# Patient Record
Sex: Male | Born: 1950
Health system: Southern US, Community
[De-identification: ages and names within clinical notes are randomized; demographics above are authoritative.]

---

## 2008-09-11 ENCOUNTER — Encounter: Admission: RE | Admit: 2008-09-11 | Discharge: 2008-09-11 | Payer: Self-pay | Admitting: Family Medicine

## 2009-11-06 IMAGING — CR DG CHEST 2V
2 series · 2 of 2 positions shown · non-contrast
Comparison: None

CLINICAL DATA: Right posterior chest pain inferior to the right
scapula and radiating to the axilla.

CHEST - 2 VIEW

[w chest pa]
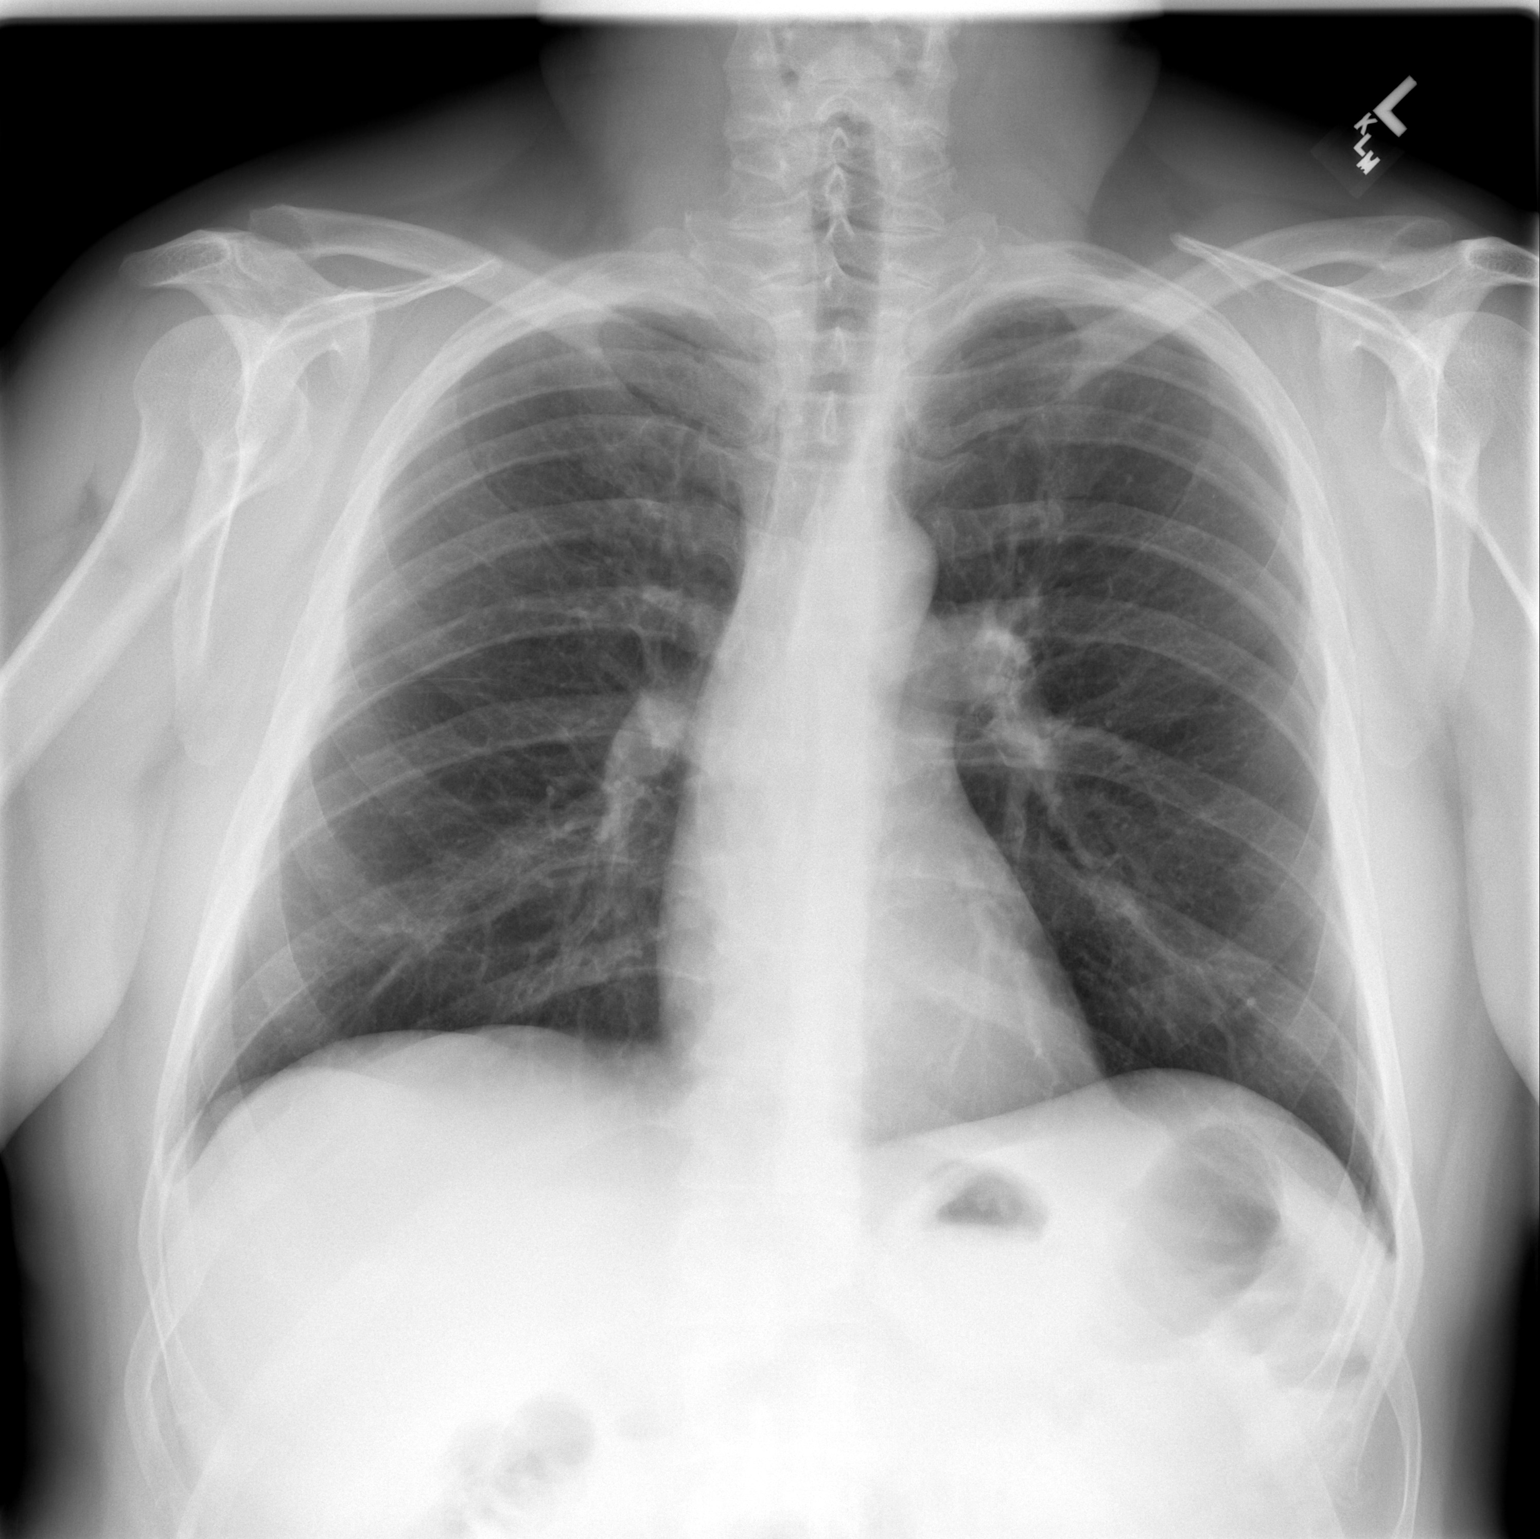

[w chest lat]
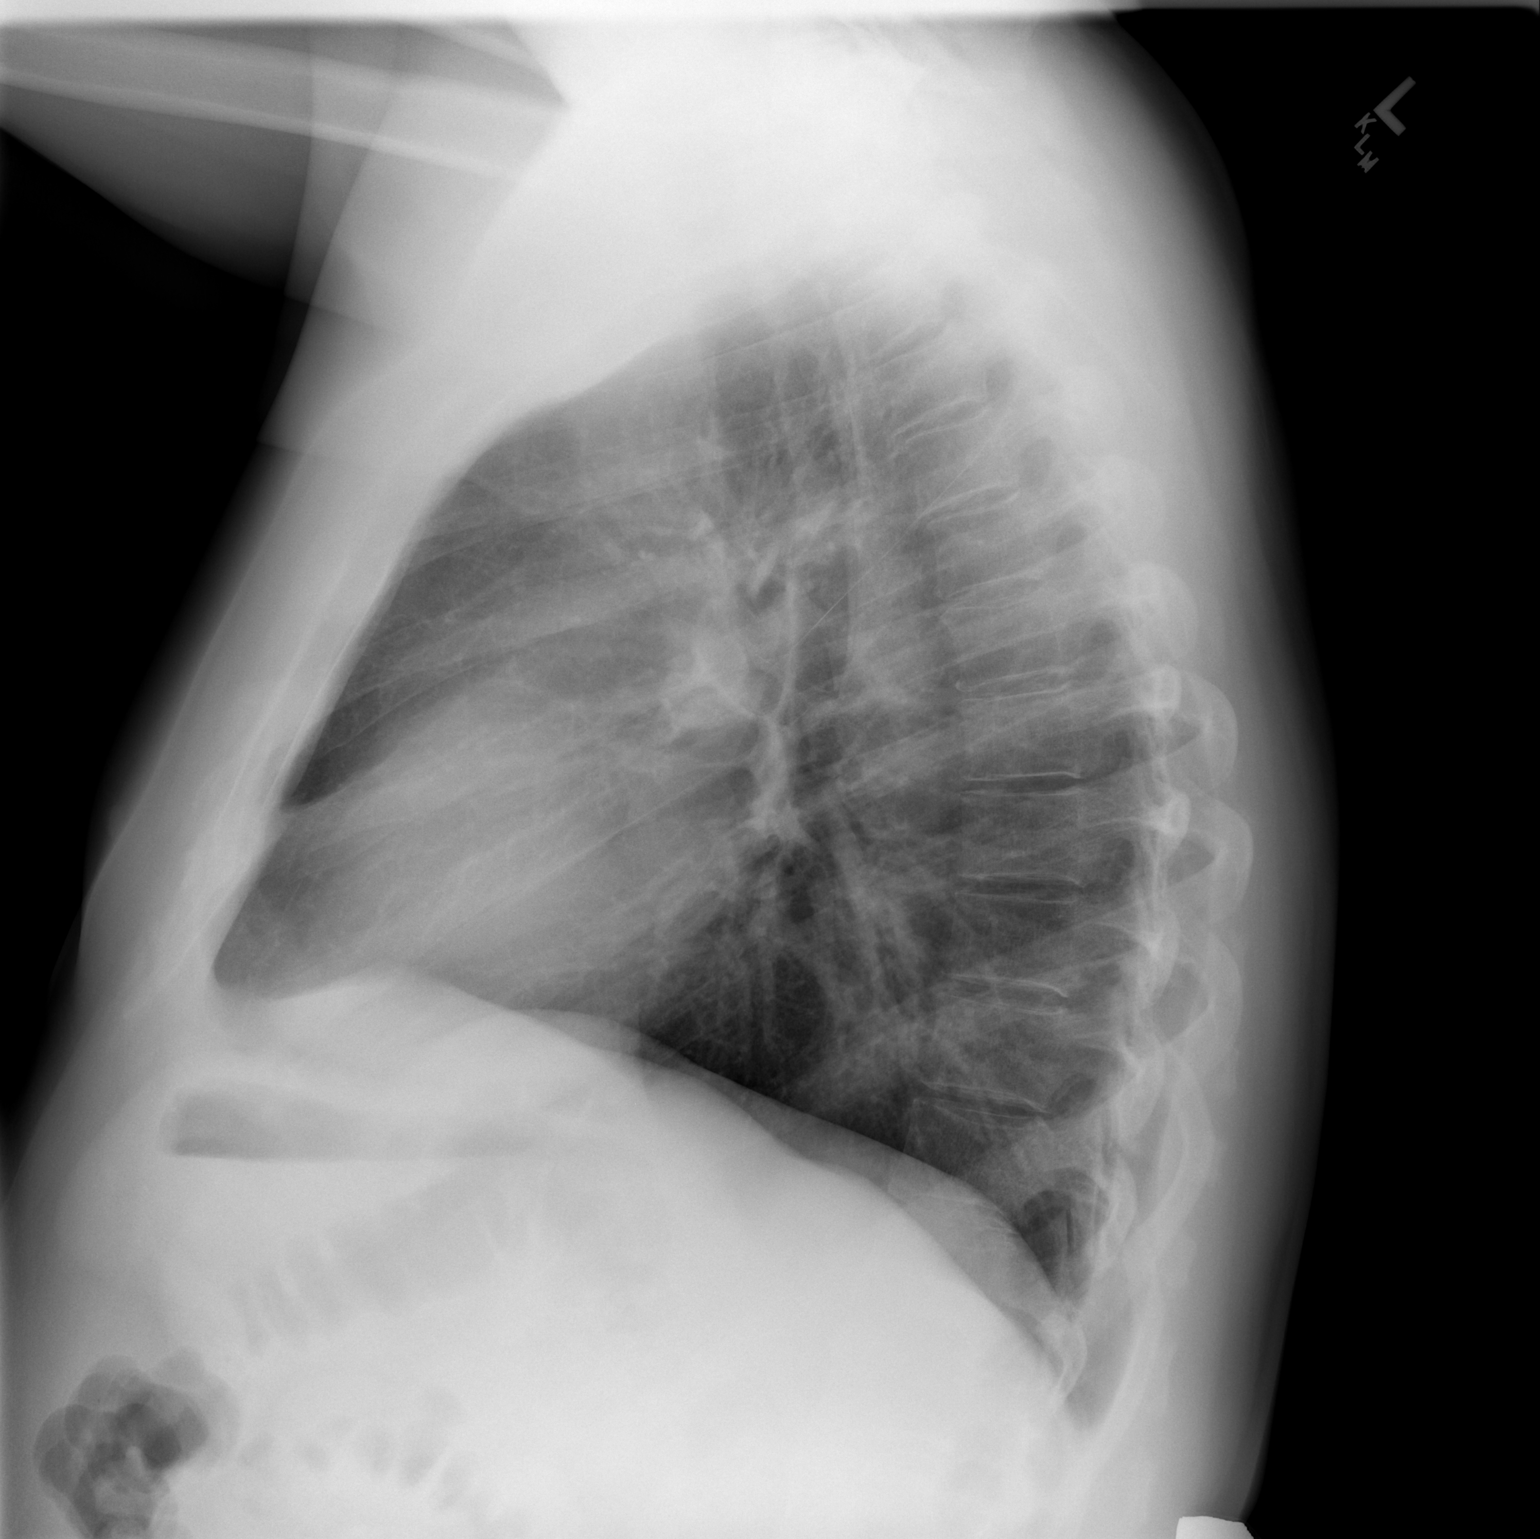

[2 of 2 positions shown; findings below may reference images not displayed]

FINDINGS: The heart size and pulmonary vascularity are normal and the lungs
are clear.  No significant bony abnormalities.
IMPRESSION: Normal chest.

## 2016-07-17 DIAGNOSIS — R6889 Other general symptoms and signs: Secondary | ICD-10-CM | POA: Diagnosis not present

## 2016-07-17 DIAGNOSIS — J069 Acute upper respiratory infection, unspecified: Secondary | ICD-10-CM | POA: Diagnosis not present

## 2016-07-17 DIAGNOSIS — R05 Cough: Secondary | ICD-10-CM | POA: Diagnosis not present

## 2016-09-05 DIAGNOSIS — T169XXA Foreign body in ear, unspecified ear, initial encounter: Secondary | ICD-10-CM | POA: Diagnosis not present

## 2016-10-29 DIAGNOSIS — E7800 Pure hypercholesterolemia, unspecified: Secondary | ICD-10-CM | POA: Diagnosis not present

## 2016-10-29 DIAGNOSIS — N529 Male erectile dysfunction, unspecified: Secondary | ICD-10-CM | POA: Diagnosis not present

## 2016-10-29 DIAGNOSIS — Z0000 Encounter for general adult medical examination without abnormal findings: Secondary | ICD-10-CM | POA: Diagnosis not present

## 2016-10-29 DIAGNOSIS — Z1159 Encounter for screening for other viral diseases: Secondary | ICD-10-CM | POA: Diagnosis not present

## 2016-10-29 DIAGNOSIS — Z13220 Encounter for screening for lipoid disorders: Secondary | ICD-10-CM | POA: Diagnosis not present

## 2016-10-29 DIAGNOSIS — Z23 Encounter for immunization: Secondary | ICD-10-CM | POA: Diagnosis not present

## 2016-10-29 DIAGNOSIS — Z86010 Personal history of colonic polyps: Secondary | ICD-10-CM | POA: Diagnosis not present

## 2016-10-29 DIAGNOSIS — I10 Essential (primary) hypertension: Secondary | ICD-10-CM | POA: Diagnosis not present

## 2017-02-21 DIAGNOSIS — Z23 Encounter for immunization: Secondary | ICD-10-CM | POA: Diagnosis not present

## 2017-06-08 DIAGNOSIS — J300 Vasomotor rhinitis: Secondary | ICD-10-CM | POA: Diagnosis not present

## 2017-06-08 DIAGNOSIS — R0683 Snoring: Secondary | ICD-10-CM | POA: Diagnosis not present

## 2017-08-05 DIAGNOSIS — D1801 Hemangioma of skin and subcutaneous tissue: Secondary | ICD-10-CM | POA: Diagnosis not present

## 2017-08-05 DIAGNOSIS — L821 Other seborrheic keratosis: Secondary | ICD-10-CM | POA: Diagnosis not present

## 2017-08-05 DIAGNOSIS — L720 Epidermal cyst: Secondary | ICD-10-CM | POA: Diagnosis not present

## 2017-08-05 DIAGNOSIS — D2371 Other benign neoplasm of skin of right lower limb, including hip: Secondary | ICD-10-CM | POA: Diagnosis not present

## 2017-08-05 DIAGNOSIS — L578 Other skin changes due to chronic exposure to nonionizing radiation: Secondary | ICD-10-CM | POA: Diagnosis not present

## 2017-11-04 DIAGNOSIS — Z23 Encounter for immunization: Secondary | ICD-10-CM | POA: Diagnosis not present

## 2017-11-04 DIAGNOSIS — Z125 Encounter for screening for malignant neoplasm of prostate: Secondary | ICD-10-CM | POA: Diagnosis not present

## 2017-11-04 DIAGNOSIS — I10 Essential (primary) hypertension: Secondary | ICD-10-CM | POA: Diagnosis not present

## 2017-11-04 DIAGNOSIS — E7800 Pure hypercholesterolemia, unspecified: Secondary | ICD-10-CM | POA: Diagnosis not present

## 2017-11-04 DIAGNOSIS — N529 Male erectile dysfunction, unspecified: Secondary | ICD-10-CM | POA: Diagnosis not present

## 2017-11-04 DIAGNOSIS — Z0000 Encounter for general adult medical examination without abnormal findings: Secondary | ICD-10-CM | POA: Diagnosis not present

## 2017-11-04 DIAGNOSIS — Z86010 Personal history of colonic polyps: Secondary | ICD-10-CM | POA: Diagnosis not present

## 2018-08-09 DIAGNOSIS — D1801 Hemangioma of skin and subcutaneous tissue: Secondary | ICD-10-CM | POA: Diagnosis not present

## 2018-08-09 DIAGNOSIS — D239 Other benign neoplasm of skin, unspecified: Secondary | ICD-10-CM | POA: Diagnosis not present

## 2018-08-09 DIAGNOSIS — L720 Epidermal cyst: Secondary | ICD-10-CM | POA: Diagnosis not present

## 2018-08-09 DIAGNOSIS — L300 Nummular dermatitis: Secondary | ICD-10-CM | POA: Diagnosis not present

## 2018-08-09 DIAGNOSIS — L578 Other skin changes due to chronic exposure to nonionizing radiation: Secondary | ICD-10-CM | POA: Diagnosis not present

## 2018-08-16 DIAGNOSIS — L720 Epidermal cyst: Secondary | ICD-10-CM | POA: Diagnosis not present

## 2018-12-21 ENCOUNTER — Other Ambulatory Visit: Payer: Self-pay

## 2018-12-21 DIAGNOSIS — Z20822 Contact with and (suspected) exposure to covid-19: Secondary | ICD-10-CM

## 2018-12-23 LAB — NOVEL CORONAVIRUS, NAA: SARS-CoV-2, NAA: NOT DETECTED

## 2019-01-18 DIAGNOSIS — E7800 Pure hypercholesterolemia, unspecified: Secondary | ICD-10-CM | POA: Diagnosis not present

## 2019-01-18 DIAGNOSIS — N529 Male erectile dysfunction, unspecified: Secondary | ICD-10-CM | POA: Diagnosis not present

## 2019-01-18 DIAGNOSIS — Z0000 Encounter for general adult medical examination without abnormal findings: Secondary | ICD-10-CM | POA: Diagnosis not present

## 2019-01-18 DIAGNOSIS — Z1389 Encounter for screening for other disorder: Secondary | ICD-10-CM | POA: Diagnosis not present

## 2019-01-18 DIAGNOSIS — I10 Essential (primary) hypertension: Secondary | ICD-10-CM | POA: Diagnosis not present

## 2019-01-18 DIAGNOSIS — Z125 Encounter for screening for malignant neoplasm of prostate: Secondary | ICD-10-CM | POA: Diagnosis not present

## 2019-01-18 DIAGNOSIS — Z23 Encounter for immunization: Secondary | ICD-10-CM | POA: Diagnosis not present

## 2019-01-18 DIAGNOSIS — Z86010 Personal history of colonic polyps: Secondary | ICD-10-CM | POA: Diagnosis not present

## 2019-03-07 DIAGNOSIS — Z20828 Contact with and (suspected) exposure to other viral communicable diseases: Secondary | ICD-10-CM | POA: Diagnosis not present

## 2019-03-14 DIAGNOSIS — Z20828 Contact with and (suspected) exposure to other viral communicable diseases: Secondary | ICD-10-CM | POA: Diagnosis not present

## 2019-03-18 DIAGNOSIS — Z20828 Contact with and (suspected) exposure to other viral communicable diseases: Secondary | ICD-10-CM | POA: Diagnosis not present

## 2019-06-06 DIAGNOSIS — H524 Presbyopia: Secondary | ICD-10-CM | POA: Diagnosis not present

## 2019-06-06 DIAGNOSIS — H25813 Combined forms of age-related cataract, bilateral: Secondary | ICD-10-CM | POA: Diagnosis not present

## 2019-07-25 DIAGNOSIS — Z23 Encounter for immunization: Secondary | ICD-10-CM | POA: Diagnosis not present

## 2019-08-14 DIAGNOSIS — D128 Benign neoplasm of rectum: Secondary | ICD-10-CM | POA: Diagnosis not present

## 2019-08-14 DIAGNOSIS — K5730 Diverticulosis of large intestine without perforation or abscess without bleeding: Secondary | ICD-10-CM | POA: Diagnosis not present

## 2019-08-14 DIAGNOSIS — Z86010 Personal history of colonic polyps: Secondary | ICD-10-CM | POA: Diagnosis not present

## 2019-08-14 DIAGNOSIS — Z1211 Encounter for screening for malignant neoplasm of colon: Secondary | ICD-10-CM | POA: Diagnosis not present

## 2019-08-14 DIAGNOSIS — K621 Rectal polyp: Secondary | ICD-10-CM | POA: Diagnosis not present

## 2019-11-27 DIAGNOSIS — L578 Other skin changes due to chronic exposure to nonionizing radiation: Secondary | ICD-10-CM | POA: Diagnosis not present

## 2019-11-27 DIAGNOSIS — L821 Other seborrheic keratosis: Secondary | ICD-10-CM | POA: Diagnosis not present

## 2019-11-27 DIAGNOSIS — D1801 Hemangioma of skin and subcutaneous tissue: Secondary | ICD-10-CM | POA: Diagnosis not present

## 2019-11-27 DIAGNOSIS — D225 Melanocytic nevi of trunk: Secondary | ICD-10-CM | POA: Diagnosis not present

## 2020-01-29 DIAGNOSIS — R208 Other disturbances of skin sensation: Secondary | ICD-10-CM | POA: Diagnosis not present

## 2020-01-29 DIAGNOSIS — R0683 Snoring: Secondary | ICD-10-CM | POA: Diagnosis not present

## 2020-01-29 DIAGNOSIS — E7800 Pure hypercholesterolemia, unspecified: Secondary | ICD-10-CM | POA: Diagnosis not present

## 2020-01-29 DIAGNOSIS — I10 Essential (primary) hypertension: Secondary | ICD-10-CM | POA: Diagnosis not present

## 2020-01-29 DIAGNOSIS — Z1389 Encounter for screening for other disorder: Secondary | ICD-10-CM | POA: Diagnosis not present

## 2020-01-29 DIAGNOSIS — Z0000 Encounter for general adult medical examination without abnormal findings: Secondary | ICD-10-CM | POA: Diagnosis not present

## 2020-01-29 DIAGNOSIS — N529 Male erectile dysfunction, unspecified: Secondary | ICD-10-CM | POA: Diagnosis not present

## 2020-01-29 DIAGNOSIS — Z125 Encounter for screening for malignant neoplasm of prostate: Secondary | ICD-10-CM | POA: Diagnosis not present

## 2020-02-21 DIAGNOSIS — G4733 Obstructive sleep apnea (adult) (pediatric): Secondary | ICD-10-CM | POA: Diagnosis not present

## 2020-05-07 DIAGNOSIS — G4719 Other hypersomnia: Secondary | ICD-10-CM | POA: Diagnosis not present

## 2020-05-07 DIAGNOSIS — I10 Essential (primary) hypertension: Secondary | ICD-10-CM | POA: Diagnosis not present

## 2020-05-09 ENCOUNTER — Other Ambulatory Visit (HOSPITAL_BASED_OUTPATIENT_CLINIC_OR_DEPARTMENT_OTHER): Payer: Self-pay

## 2020-05-09 DIAGNOSIS — R0681 Apnea, not elsewhere classified: Secondary | ICD-10-CM

## 2020-05-09 DIAGNOSIS — G2581 Restless legs syndrome: Secondary | ICD-10-CM

## 2020-05-09 DIAGNOSIS — R0683 Snoring: Secondary | ICD-10-CM

## 2020-05-09 DIAGNOSIS — G4710 Hypersomnia, unspecified: Secondary | ICD-10-CM

## 2020-05-28 DIAGNOSIS — L821 Other seborrheic keratosis: Secondary | ICD-10-CM | POA: Diagnosis not present

## 2020-05-28 DIAGNOSIS — L570 Actinic keratosis: Secondary | ICD-10-CM | POA: Diagnosis not present

## 2020-05-28 DIAGNOSIS — L820 Inflamed seborrheic keratosis: Secondary | ICD-10-CM | POA: Diagnosis not present

## 2020-05-28 DIAGNOSIS — D1801 Hemangioma of skin and subcutaneous tissue: Secondary | ICD-10-CM | POA: Diagnosis not present

## 2020-06-26 ENCOUNTER — Ambulatory Visit (HOSPITAL_BASED_OUTPATIENT_CLINIC_OR_DEPARTMENT_OTHER): Payer: Medicare Other | Attending: Internal Medicine | Admitting: Internal Medicine

## 2020-06-26 ENCOUNTER — Other Ambulatory Visit: Payer: Self-pay

## 2020-06-26 VITALS — Ht 71.0 in | Wt 190.0 lb

## 2020-06-26 DIAGNOSIS — G4733 Obstructive sleep apnea (adult) (pediatric): Secondary | ICD-10-CM

## 2020-06-26 DIAGNOSIS — R0683 Snoring: Secondary | ICD-10-CM

## 2020-06-26 DIAGNOSIS — R0681 Apnea, not elsewhere classified: Secondary | ICD-10-CM

## 2020-06-26 DIAGNOSIS — G4710 Hypersomnia, unspecified: Secondary | ICD-10-CM | POA: Insufficient documentation

## 2020-06-26 DIAGNOSIS — G2581 Restless legs syndrome: Secondary | ICD-10-CM

## 2020-06-30 DIAGNOSIS — G4733 Obstructive sleep apnea (adult) (pediatric): Secondary | ICD-10-CM | POA: Diagnosis not present

## 2020-06-30 NOTE — Procedures (Signed)
   NAME: Gary Glover DATE OF BIRTH:  March 26, 1950 MEDICAL RECORD NUMBER [id number]  LOCATION: [facility]  PHYSICIAN: Marius Ditch  DATE OF STUDY: 06/26/2020  SLEEP STUDY TYPE: Nocturnal Polysomnogram               REFERRING PHYSICIAN: Loma Sousa, MD  EPWORTH SLEEPINESS SCORE:  13 HEIGHT: 5\' 11"  (180.3 cm)  WEIGHT: 190 lb (86.2 kg)    Body mass index is 26.5 kg/m.  NECK SIZE: 16.25 in.  CLINICAL INFORMATION The patient was referred to the sleep center for evaluation of witnessed apnea, excessive daytime sleepiness, awakening gasping for air.   MEDICATIONS Patient self administered medications include: N/A. Medications administered during study include No sleep medicine administered.Marland Kitchen  SLEEP STUDY TECHNIQUE A multi-channel overnight Polysomnography study was performed. The channels recorded and monitored were central and occipital EEG, electrooculogram (EOG), submentalis EMG (chin), nasal and oral airflow, thoracic and abdominal wall motion, anterior tibialis EMG, snore microphone, electrocardiogram, and a pulse oximetry.  TECHNICAL COMMENTS Comments added by Technician: Patient had difficulty initiating sleep. Comments added by Scorer: N/A  SLEEP ARCHITECTURE The study was initiated at 10:46:25 PM and terminated at 5:14:28 AM. The total recorded time was 388.1 minutes. EEG confirmed total sleep time was 228 minutes yielding a sleep efficiency of 58.8%%. Sleep onset after lights out was 92.4 minutes with a REM latency of 168.0 minutes. The patient spent 21.9%% of the night in stage N1 sleep, 57.2%% in stage N2 sleep, 0.0%% in stage N3 and 20.8% in REM. Wake after sleep onset (WASO) was 67.6 minutes. The Arousal Index was 47.1/hour.  RESPIRATORY PARAMETERS There were a total of 27 respiratory disturbances out of which 0 were apneas ( 0 obstructive, 0 mixed, 0 central) and 27 hypopneas. The apnea/hypopnea index (AHI) was 7.1 events/hour. The central sleep  apnea index was 0 events/hour. The REM AHI was 11.4 events/hour and NREM AHI was 6.0 events/hour. The supine AHI was 34.3 events/hour and the non supine AHI was 4.9 events/hour. He slept supine during 7.68% of sleep time. Respiratory disturbance index was 38 events/hour overall and 20 events/hour in REM sleep.  Respiratory disturbances were associated with oxygen desaturation down to a nadir of 81.0% during sleep. The mean oxygen saturation during the study was 95.0%. The cumulative time under 88% oxygen saturation was 1.5 minutes.  LEG MOVEMENT DATA The total leg movements were 46 with a resulting leg movement index of 12.1/hr. Many leg movements were associated with RERAs. Associated arousal with leg movement index was 0.8/hr.  CARDIAC DATA The underlying cardiac rhythm was most consistent with sinus rhythm. Mean heart rate during sleep was 59.6 bpm. Additional rhythm abnormalities include None.  IMPRESSIONS - Mild Obstructive Sleep Apnea (OSA) by AHI; severe by RDI - Decreased sleep efficiency and decreased REM sleep. - No significant periodic leg movements(PLMs) during sleep. However, no significant associated arousals.  DIAGNOSIS - Obstructive Sleep Apnea (G47.33)  RECOMMENDATIONS - Treatment choices include weight loss (if appropriate), oral appliance or CPAP.  Marius Ditch Sleep specialist, [place] [employer]  ELECTRONICALLY SIGNED ON:  06/30/2020, 2:05 PM [place] PH: [phone]   FX: [phone] [place]

## 2020-10-03 DIAGNOSIS — G4733 Obstructive sleep apnea (adult) (pediatric): Secondary | ICD-10-CM | POA: Diagnosis not present

## 2020-11-03 DIAGNOSIS — G4733 Obstructive sleep apnea (adult) (pediatric): Secondary | ICD-10-CM | POA: Diagnosis not present

## 2020-12-04 DIAGNOSIS — G4733 Obstructive sleep apnea (adult) (pediatric): Secondary | ICD-10-CM | POA: Diagnosis not present

## 2020-12-10 DIAGNOSIS — G4733 Obstructive sleep apnea (adult) (pediatric): Secondary | ICD-10-CM | POA: Diagnosis not present

## 2021-01-08 DIAGNOSIS — G4733 Obstructive sleep apnea (adult) (pediatric): Secondary | ICD-10-CM | POA: Diagnosis not present

## 2021-02-03 DIAGNOSIS — Z0000 Encounter for general adult medical examination without abnormal findings: Secondary | ICD-10-CM | POA: Diagnosis not present

## 2021-02-03 DIAGNOSIS — Z125 Encounter for screening for malignant neoplasm of prostate: Secondary | ICD-10-CM | POA: Diagnosis not present

## 2021-02-03 DIAGNOSIS — E7800 Pure hypercholesterolemia, unspecified: Secondary | ICD-10-CM | POA: Diagnosis not present

## 2021-02-03 DIAGNOSIS — I10 Essential (primary) hypertension: Secondary | ICD-10-CM | POA: Diagnosis not present

## 2021-02-07 DIAGNOSIS — G4733 Obstructive sleep apnea (adult) (pediatric): Secondary | ICD-10-CM | POA: Diagnosis not present

## 2021-03-10 DIAGNOSIS — G4733 Obstructive sleep apnea (adult) (pediatric): Secondary | ICD-10-CM | POA: Diagnosis not present

## 2021-04-08 DIAGNOSIS — H524 Presbyopia: Secondary | ICD-10-CM | POA: Diagnosis not present

## 2021-04-09 DIAGNOSIS — G4733 Obstructive sleep apnea (adult) (pediatric): Secondary | ICD-10-CM | POA: Diagnosis not present

## 2021-04-10 DIAGNOSIS — G4733 Obstructive sleep apnea (adult) (pediatric): Secondary | ICD-10-CM | POA: Diagnosis not present

## 2021-05-07 DIAGNOSIS — G4733 Obstructive sleep apnea (adult) (pediatric): Secondary | ICD-10-CM | POA: Diagnosis not present

## 2021-06-03 DIAGNOSIS — H61191 Noninfective disorders of pinna, right ear: Secondary | ICD-10-CM | POA: Diagnosis not present

## 2021-06-03 DIAGNOSIS — X32XXXS Exposure to sunlight, sequela: Secondary | ICD-10-CM | POA: Diagnosis not present

## 2021-06-03 DIAGNOSIS — L814 Other melanin hyperpigmentation: Secondary | ICD-10-CM | POA: Diagnosis not present

## 2021-06-03 DIAGNOSIS — L821 Other seborrheic keratosis: Secondary | ICD-10-CM | POA: Diagnosis not present

## 2021-06-07 DIAGNOSIS — G4733 Obstructive sleep apnea (adult) (pediatric): Secondary | ICD-10-CM | POA: Diagnosis not present

## 2021-07-08 DIAGNOSIS — G4733 Obstructive sleep apnea (adult) (pediatric): Secondary | ICD-10-CM | POA: Diagnosis not present

## 2021-08-08 DIAGNOSIS — G4733 Obstructive sleep apnea (adult) (pediatric): Secondary | ICD-10-CM | POA: Diagnosis not present

## 2021-09-07 DIAGNOSIS — G4733 Obstructive sleep apnea (adult) (pediatric): Secondary | ICD-10-CM | POA: Diagnosis not present

## 2021-09-17 DIAGNOSIS — H5213 Myopia, bilateral: Secondary | ICD-10-CM | POA: Diagnosis not present

## 2021-10-22 DIAGNOSIS — G4733 Obstructive sleep apnea (adult) (pediatric): Secondary | ICD-10-CM | POA: Diagnosis not present

## 2021-11-22 DIAGNOSIS — G4733 Obstructive sleep apnea (adult) (pediatric): Secondary | ICD-10-CM | POA: Diagnosis not present

## 2021-12-22 DIAGNOSIS — G4733 Obstructive sleep apnea (adult) (pediatric): Secondary | ICD-10-CM | POA: Diagnosis not present

## 2021-12-24 DIAGNOSIS — L570 Actinic keratosis: Secondary | ICD-10-CM | POA: Diagnosis not present

## 2021-12-24 DIAGNOSIS — L720 Epidermal cyst: Secondary | ICD-10-CM | POA: Diagnosis not present

## 2021-12-25 DIAGNOSIS — G4733 Obstructive sleep apnea (adult) (pediatric): Secondary | ICD-10-CM | POA: Diagnosis not present

## 2022-02-11 DIAGNOSIS — E7800 Pure hypercholesterolemia, unspecified: Secondary | ICD-10-CM | POA: Diagnosis not present

## 2022-02-11 DIAGNOSIS — I10 Essential (primary) hypertension: Secondary | ICD-10-CM | POA: Diagnosis not present

## 2022-02-11 DIAGNOSIS — Z125 Encounter for screening for malignant neoplasm of prostate: Secondary | ICD-10-CM | POA: Diagnosis not present

## 2022-02-11 DIAGNOSIS — Z0000 Encounter for general adult medical examination without abnormal findings: Secondary | ICD-10-CM | POA: Diagnosis not present

## 2022-05-07 DIAGNOSIS — G4733 Obstructive sleep apnea (adult) (pediatric): Secondary | ICD-10-CM | POA: Diagnosis not present

## 2022-05-27 DIAGNOSIS — K080 Exfoliation of teeth due to systemic causes: Secondary | ICD-10-CM | POA: Diagnosis not present

## 2022-06-05 DIAGNOSIS — G4733 Obstructive sleep apnea (adult) (pediatric): Secondary | ICD-10-CM | POA: Diagnosis not present

## 2022-07-01 DIAGNOSIS — D692 Other nonthrombocytopenic purpura: Secondary | ICD-10-CM | POA: Diagnosis not present

## 2022-07-01 DIAGNOSIS — L821 Other seborrheic keratosis: Secondary | ICD-10-CM | POA: Diagnosis not present

## 2022-07-01 DIAGNOSIS — L720 Epidermal cyst: Secondary | ICD-10-CM | POA: Diagnosis not present

## 2022-07-01 DIAGNOSIS — L570 Actinic keratosis: Secondary | ICD-10-CM | POA: Diagnosis not present

## 2022-07-06 DIAGNOSIS — G4733 Obstructive sleep apnea (adult) (pediatric): Secondary | ICD-10-CM | POA: Diagnosis not present

## 2022-09-01 DIAGNOSIS — G4733 Obstructive sleep apnea (adult) (pediatric): Secondary | ICD-10-CM | POA: Diagnosis not present

## 2022-10-01 DIAGNOSIS — G4733 Obstructive sleep apnea (adult) (pediatric): Secondary | ICD-10-CM | POA: Diagnosis not present

## 2022-11-01 DIAGNOSIS — G4733 Obstructive sleep apnea (adult) (pediatric): Secondary | ICD-10-CM | POA: Diagnosis not present

## 2022-12-30 DIAGNOSIS — K080 Exfoliation of teeth due to systemic causes: Secondary | ICD-10-CM | POA: Diagnosis not present

## 2023-01-27 DIAGNOSIS — G4733 Obstructive sleep apnea (adult) (pediatric): Secondary | ICD-10-CM | POA: Diagnosis not present

## 2023-01-27 DIAGNOSIS — L821 Other seborrheic keratosis: Secondary | ICD-10-CM | POA: Diagnosis not present

## 2023-01-27 DIAGNOSIS — L578 Other skin changes due to chronic exposure to nonionizing radiation: Secondary | ICD-10-CM | POA: Diagnosis not present

## 2023-01-27 DIAGNOSIS — L570 Actinic keratosis: Secondary | ICD-10-CM | POA: Diagnosis not present

## 2023-01-27 DIAGNOSIS — L720 Epidermal cyst: Secondary | ICD-10-CM | POA: Diagnosis not present

## 2023-01-27 DIAGNOSIS — D1800 Hemangioma unspecified site: Secondary | ICD-10-CM | POA: Diagnosis not present

## 2023-02-16 DIAGNOSIS — N529 Male erectile dysfunction, unspecified: Secondary | ICD-10-CM | POA: Diagnosis not present

## 2023-02-16 DIAGNOSIS — I10 Essential (primary) hypertension: Secondary | ICD-10-CM | POA: Diagnosis not present

## 2023-02-16 DIAGNOSIS — Z125 Encounter for screening for malignant neoplasm of prostate: Secondary | ICD-10-CM | POA: Diagnosis not present

## 2023-02-16 DIAGNOSIS — Z0000 Encounter for general adult medical examination without abnormal findings: Secondary | ICD-10-CM | POA: Diagnosis not present

## 2023-02-16 DIAGNOSIS — Z131 Encounter for screening for diabetes mellitus: Secondary | ICD-10-CM | POA: Diagnosis not present

## 2023-02-16 DIAGNOSIS — E782 Mixed hyperlipidemia: Secondary | ICD-10-CM | POA: Diagnosis not present

## 2023-02-16 DIAGNOSIS — Z23 Encounter for immunization: Secondary | ICD-10-CM | POA: Diagnosis not present

## 2023-02-16 DIAGNOSIS — R053 Chronic cough: Secondary | ICD-10-CM | POA: Diagnosis not present

## 2023-02-26 DIAGNOSIS — G4733 Obstructive sleep apnea (adult) (pediatric): Secondary | ICD-10-CM | POA: Diagnosis not present

## 2023-03-29 DIAGNOSIS — G4733 Obstructive sleep apnea (adult) (pediatric): Secondary | ICD-10-CM | POA: Diagnosis not present

## 2023-05-13 DIAGNOSIS — H5213 Myopia, bilateral: Secondary | ICD-10-CM | POA: Diagnosis not present

## 2023-05-13 DIAGNOSIS — H43393 Other vitreous opacities, bilateral: Secondary | ICD-10-CM | POA: Diagnosis not present

## 2023-06-08 DIAGNOSIS — H52223 Regular astigmatism, bilateral: Secondary | ICD-10-CM | POA: Diagnosis not present

## 2023-06-08 DIAGNOSIS — H25813 Combined forms of age-related cataract, bilateral: Secondary | ICD-10-CM | POA: Diagnosis not present

## 2023-06-15 DIAGNOSIS — E782 Mixed hyperlipidemia: Secondary | ICD-10-CM | POA: Diagnosis not present

## 2023-07-07 DIAGNOSIS — K080 Exfoliation of teeth due to systemic causes: Secondary | ICD-10-CM | POA: Diagnosis not present

## 2023-07-17 DIAGNOSIS — G4733 Obstructive sleep apnea (adult) (pediatric): Secondary | ICD-10-CM | POA: Diagnosis not present

## 2023-07-28 DIAGNOSIS — L821 Other seborrheic keratosis: Secondary | ICD-10-CM | POA: Diagnosis not present

## 2023-07-28 DIAGNOSIS — L720 Epidermal cyst: Secondary | ICD-10-CM | POA: Diagnosis not present

## 2023-07-28 DIAGNOSIS — L570 Actinic keratosis: Secondary | ICD-10-CM | POA: Diagnosis not present

## 2023-07-28 DIAGNOSIS — L578 Other skin changes due to chronic exposure to nonionizing radiation: Secondary | ICD-10-CM | POA: Diagnosis not present

## 2023-08-17 DIAGNOSIS — G4733 Obstructive sleep apnea (adult) (pediatric): Secondary | ICD-10-CM | POA: Diagnosis not present

## 2023-09-08 DIAGNOSIS — H25813 Combined forms of age-related cataract, bilateral: Secondary | ICD-10-CM | POA: Diagnosis not present

## 2023-09-28 DIAGNOSIS — H25813 Combined forms of age-related cataract, bilateral: Secondary | ICD-10-CM | POA: Diagnosis not present

## 2023-10-12 DIAGNOSIS — H5213 Myopia, bilateral: Secondary | ICD-10-CM | POA: Diagnosis not present

## 2023-10-12 DIAGNOSIS — H25811 Combined forms of age-related cataract, right eye: Secondary | ICD-10-CM | POA: Diagnosis not present

## 2023-10-21 DIAGNOSIS — Z79899 Other long term (current) drug therapy: Secondary | ICD-10-CM | POA: Diagnosis not present

## 2023-10-21 DIAGNOSIS — H25812 Combined forms of age-related cataract, left eye: Secondary | ICD-10-CM | POA: Diagnosis not present

## 2023-10-21 DIAGNOSIS — Z881 Allergy status to other antibiotic agents status: Secondary | ICD-10-CM | POA: Diagnosis not present

## 2024-01-12 DIAGNOSIS — K080 Exfoliation of teeth due to systemic causes: Secondary | ICD-10-CM | POA: Diagnosis not present

## 2024-01-24 DIAGNOSIS — L570 Actinic keratosis: Secondary | ICD-10-CM | POA: Diagnosis not present

## 2024-01-24 DIAGNOSIS — L821 Other seborrheic keratosis: Secondary | ICD-10-CM | POA: Diagnosis not present

## 2024-01-24 DIAGNOSIS — L720 Epidermal cyst: Secondary | ICD-10-CM | POA: Diagnosis not present

## 2024-01-24 DIAGNOSIS — L578 Other skin changes due to chronic exposure to nonionizing radiation: Secondary | ICD-10-CM | POA: Diagnosis not present
